# Patient Record
Sex: Female | Born: 1955 | Race: White | Hispanic: No | Marital: Married | Smoking: Never smoker
Health system: Southern US, Community
[De-identification: ages and names within clinical notes are randomized; demographics above are authoritative.]

## PROBLEM LIST (undated history)

## (undated) HISTORY — PX: BREAST BIOPSY: SHX20

## (undated) HISTORY — PX: BREAST EXCISIONAL BIOPSY: SUR124

---

## 1997-10-01 ENCOUNTER — Other Ambulatory Visit: Admission: RE | Admit: 1997-10-01 | Discharge: 1997-10-01 | Payer: Self-pay | Admitting: Obstetrics and Gynecology

## 1998-10-08 ENCOUNTER — Other Ambulatory Visit: Admission: RE | Admit: 1998-10-08 | Discharge: 1998-10-08 | Payer: Self-pay | Admitting: Obstetrics and Gynecology

## 1999-10-01 ENCOUNTER — Encounter: Admission: RE | Admit: 1999-10-01 | Discharge: 1999-10-01 | Payer: Self-pay | Admitting: Obstetrics and Gynecology

## 1999-10-01 ENCOUNTER — Encounter: Payer: Self-pay | Admitting: Obstetrics and Gynecology

## 1999-10-28 ENCOUNTER — Other Ambulatory Visit: Admission: RE | Admit: 1999-10-28 | Discharge: 1999-10-28 | Payer: Self-pay | Admitting: Obstetrics and Gynecology

## 2000-11-29 ENCOUNTER — Other Ambulatory Visit: Admission: RE | Admit: 2000-11-29 | Discharge: 2000-11-29 | Payer: Self-pay | Admitting: Obstetrics and Gynecology

## 2001-01-25 ENCOUNTER — Encounter: Admission: RE | Admit: 2001-01-25 | Discharge: 2001-01-25 | Payer: Self-pay | Admitting: Obstetrics and Gynecology

## 2001-01-25 ENCOUNTER — Encounter: Payer: Self-pay | Admitting: Obstetrics and Gynecology

## 2002-02-01 ENCOUNTER — Other Ambulatory Visit: Admission: RE | Admit: 2002-02-01 | Discharge: 2002-02-01 | Payer: Self-pay | Admitting: Obstetrics and Gynecology

## 2002-03-01 ENCOUNTER — Encounter: Admission: RE | Admit: 2002-03-01 | Discharge: 2002-03-01 | Payer: Self-pay | Admitting: Obstetrics and Gynecology

## 2002-03-01 ENCOUNTER — Encounter: Payer: Self-pay | Admitting: Obstetrics and Gynecology

## 2003-04-16 ENCOUNTER — Encounter: Admission: RE | Admit: 2003-04-16 | Discharge: 2003-04-16 | Payer: Self-pay | Admitting: Obstetrics and Gynecology

## 2003-06-27 ENCOUNTER — Other Ambulatory Visit: Admission: RE | Admit: 2003-06-27 | Discharge: 2003-06-27 | Payer: Self-pay | Admitting: Obstetrics and Gynecology

## 2004-08-19 ENCOUNTER — Ambulatory Visit (HOSPITAL_COMMUNITY): Admission: RE | Admit: 2004-08-19 | Discharge: 2004-08-19 | Payer: Self-pay | Admitting: Obstetrics and Gynecology

## 2005-09-23 ENCOUNTER — Ambulatory Visit (HOSPITAL_COMMUNITY): Admission: RE | Admit: 2005-09-23 | Discharge: 2005-09-23 | Payer: Self-pay | Admitting: Obstetrics and Gynecology

## 2006-12-24 ENCOUNTER — Ambulatory Visit (HOSPITAL_COMMUNITY): Admission: RE | Admit: 2006-12-24 | Discharge: 2006-12-24 | Payer: Self-pay | Admitting: Obstetrics and Gynecology

## 2008-03-20 ENCOUNTER — Ambulatory Visit (HOSPITAL_COMMUNITY): Admission: RE | Admit: 2008-03-20 | Discharge: 2008-03-20 | Payer: Self-pay | Admitting: Obstetrics & Gynecology

## 2009-10-03 ENCOUNTER — Ambulatory Visit (HOSPITAL_COMMUNITY): Admission: RE | Admit: 2009-10-03 | Discharge: 2009-10-03 | Payer: Self-pay | Admitting: Obstetrics & Gynecology

## 2011-01-07 ENCOUNTER — Other Ambulatory Visit (HOSPITAL_COMMUNITY): Payer: Self-pay | Admitting: Obstetrics & Gynecology

## 2011-01-07 DIAGNOSIS — Z1231 Encounter for screening mammogram for malignant neoplasm of breast: Secondary | ICD-10-CM

## 2011-01-15 ENCOUNTER — Ambulatory Visit (HOSPITAL_COMMUNITY)
Admission: RE | Admit: 2011-01-15 | Discharge: 2011-01-15 | Disposition: A | Discharge status: Home / Self Care | Payer: BC Managed Care – PPO | Source: Ambulatory Visit | Attending: Obstetrics & Gynecology | Admitting: Obstetrics & Gynecology

## 2011-01-15 DIAGNOSIS — Z1231 Encounter for screening mammogram for malignant neoplasm of breast: Secondary | ICD-10-CM | POA: Insufficient documentation

## 2012-05-25 ENCOUNTER — Other Ambulatory Visit (HOSPITAL_COMMUNITY): Payer: Self-pay | Admitting: Obstetrics & Gynecology

## 2012-05-25 DIAGNOSIS — Z1231 Encounter for screening mammogram for malignant neoplasm of breast: Secondary | ICD-10-CM

## 2012-05-27 ENCOUNTER — Ambulatory Visit (HOSPITAL_COMMUNITY)
Admission: RE | Admit: 2012-05-27 | Discharge: 2012-05-27 | Disposition: A | Discharge status: Home / Self Care | Payer: BC Managed Care – PPO | Source: Ambulatory Visit | Attending: Obstetrics & Gynecology | Admitting: Obstetrics & Gynecology

## 2012-05-27 DIAGNOSIS — Z1231 Encounter for screening mammogram for malignant neoplasm of breast: Secondary | ICD-10-CM | POA: Insufficient documentation

## 2013-05-26 ENCOUNTER — Other Ambulatory Visit (HOSPITAL_COMMUNITY): Payer: Self-pay | Admitting: Obstetrics & Gynecology

## 2013-05-26 DIAGNOSIS — Z1231 Encounter for screening mammogram for malignant neoplasm of breast: Secondary | ICD-10-CM

## 2013-06-26 ENCOUNTER — Ambulatory Visit (HOSPITAL_COMMUNITY)
Admission: RE | Admit: 2013-06-26 | Discharge: 2013-06-26 | Disposition: A | Discharge status: Home / Self Care | Payer: BC Managed Care – PPO | Source: Ambulatory Visit | Attending: Obstetrics & Gynecology | Admitting: Obstetrics & Gynecology

## 2013-06-26 DIAGNOSIS — Z1231 Encounter for screening mammogram for malignant neoplasm of breast: Secondary | ICD-10-CM | POA: Insufficient documentation

## 2014-06-11 ENCOUNTER — Other Ambulatory Visit (HOSPITAL_COMMUNITY): Payer: Self-pay | Admitting: Obstetrics & Gynecology

## 2014-06-11 DIAGNOSIS — Z1231 Encounter for screening mammogram for malignant neoplasm of breast: Secondary | ICD-10-CM

## 2014-06-28 ENCOUNTER — Ambulatory Visit (HOSPITAL_COMMUNITY)
Admission: RE | Admit: 2014-06-28 | Discharge: 2014-06-28 | Disposition: A | Discharge status: Home / Self Care | Payer: BLUE CROSS/BLUE SHIELD | Source: Ambulatory Visit | Attending: Obstetrics & Gynecology | Admitting: Obstetrics & Gynecology

## 2014-06-28 DIAGNOSIS — Z1231 Encounter for screening mammogram for malignant neoplasm of breast: Secondary | ICD-10-CM

## 2014-06-29 ENCOUNTER — Ambulatory Visit (HOSPITAL_COMMUNITY): Payer: BC Managed Care – PPO

## 2014-07-06 ENCOUNTER — Ambulatory Visit (HOSPITAL_COMMUNITY): Payer: BC Managed Care – PPO

## 2015-10-30 ENCOUNTER — Other Ambulatory Visit: Payer: Self-pay

## 2015-10-30 DIAGNOSIS — Z1231 Encounter for screening mammogram for malignant neoplasm of breast: Secondary | ICD-10-CM

## 2015-11-15 ENCOUNTER — Ambulatory Visit
Admission: RE | Admit: 2015-11-15 | Discharge: 2015-11-15 | Disposition: A | Discharge status: Home / Self Care | Payer: PRIVATE HEALTH INSURANCE | Source: Ambulatory Visit

## 2015-11-15 DIAGNOSIS — Z1231 Encounter for screening mammogram for malignant neoplasm of breast: Secondary | ICD-10-CM

## 2016-10-27 ENCOUNTER — Telehealth: Payer: Self-pay | Admitting: Cardiovascular Disease

## 2016-10-27 NOTE — Telephone Encounter (Signed)
Received records from [facility] for appointment on 10/29/16 with Dr Croitoru.  Records put with Dr Croitoru's schedule for 10/29/16. lp  °

## 2016-10-29 ENCOUNTER — Ambulatory Visit (INDEPENDENT_AMBULATORY_CARE_PROVIDER_SITE_OTHER): Payer: PRIVATE HEALTH INSURANCE | Admitting: Cardiovascular Disease

## 2016-10-29 ENCOUNTER — Encounter: Payer: Self-pay | Admitting: Cardiovascular Disease

## 2016-10-29 ENCOUNTER — Encounter (INDEPENDENT_AMBULATORY_CARE_PROVIDER_SITE_OTHER): Payer: Self-pay

## 2016-10-29 VITALS — BP 106/82 | HR 88 | Ht 66.0 in | Wt 125.0 lb

## 2016-10-29 DIAGNOSIS — R55 Syncope and collapse: Secondary | ICD-10-CM | POA: Diagnosis not present

## 2016-10-29 NOTE — Patient Instructions (Signed)
Dr Royann Shiversroitoru has requested that you have an echocardiogram. Echocardiography is a painless test that uses sound waves to create images of your heart. It provides your doctor with information about the size and shape of your heart and how well your heart's chambers and valves are working. This procedure takes approximately one hour. There are no restrictions for this procedure.  Your physician has recommended that you wear a 48-hour holter monitor. Holter monitors are medical devices that record the heart's electrical activity. Doctors most often use these monitors to diagnose arrhythmias. Arrhythmias are problems with the speed or rhythm of the heartbeat. The monitor is a small, portable device. You can wear one while you do your normal daily activities. This is usually used to diagnose what is causing palpitations/syncope (passing out).  These tests have been [facility] to be performed [facility] - [address], [address].  Dr Royann Shiversroitoru recommends that you schedule a follow-up appointment in 12 months. You will receive a reminder letter in the mail two months in advance. If you don't receive a letter, please call our office to schedule the follow-up appointment.  If you need a refill on your cardiac medications before your next appointment, please call your pharmacy.

## 2016-10-29 NOTE — Progress Notes (Signed)
Cardiology Consultation Note:    Date:  10/31/2016   ID:  Kelsey, Burton December 29, 1955, MRN [id number]  PCP:  Jarrett Soho, PA-C  Cardiologist:  Thurmon Fair, MD    Referring: Jarrett Soho PA  Chief Complaint  Patient presents with  . Follow-up    New patient.  . Headache   Kelsey Burton is a 61 y.o. female who is being seen today for the evaluation of Near syncope at the request of Jarrett Soho PA  History of Present Illness:    Kelsey Burton is a 61 y.o. female with a hx of Migraines, but without hypertension or known cardiovascular disease who had a single episode of near syncope roughly 10 days ago. It occurred around 9:00 in the morning when she was sitting down and watching television with her husband. "Felt funny". She has difficulty describing the exact complaints. She had nausea and felt the need to "run to the bathroom". She developed tunnel vision and was stumbling down the hallway. She did not lose consciousness completely but felt like she was very close to that.  When she checked her vital signs her blood pressure was 109/82 and heart rate was under 35 bpm. Heart rate gradually decreased over the next 30-60 minutes but was still over 100 bpm an hour later.  She denies history of syncope in the past or any similar vagal events. She does have long-standing occasional palpitations, but these have not been associated with near syncope or syncope. She had menopause roughly 8 years ago. She does not have diabetes, family history of cardiovascular disease or sudden unexpected death and she has never smoked. She has never had a stroke or TIA. She has had migraines with a relatively low frequency hearing  History reviewed. No pertinent past medical history.  History reviewed. No pertinent surgical history.  Current Medications: No outpatient prescriptions have been marked as taking for the 10/29/16 encounter (Office Visit) with Thurmon Fair, MD.       Allergies:   Patient has no allergy information on record.   Social History   Social History  . Marital status: Married    Spouse name: N/A  . Number of children: N/A  . Years of education: N/A   Social History Main Topics  . Smoking status: Never Smoker  . Smokeless tobacco: Never Used  . Alcohol use None  . Drug use: Unknown  . Sexual activity: Not Asked   Other Topics Concern  . None   Social History Narrative  . None     Family History: The patient's family history includes Cancer in her father and mother; Heart attack in her father. ROS:   Please see the history of present illness.     All other systems reviewed and are negative.  EKGs/Labs/Other Studies Reviewed:    The following studies were reviewed today: Notes from primary care provider. ECG tracing from May 15 showing normal sinus rhythm  EKG:  EKG is  ordered today.  The ekg ordered today demonstrates sinus rhythm. Unusually prominent R-wave in leads V1-V2 (Rsr').  Recent Labs: Hemoglobin 13.8, glucose 129, creatinine 0.81, potassium 3.8, normal liver function tests, normal TSH  Physical Exam:    VS:  BP 106/82   Pulse 88   Ht 5\' 6"  (1.676 m)   Wt 125 lb (56.7 kg)   BMI 20.18 kg/m     Wt Readings from Last 3 Encounters:  10/29/16 125 lb (56.7 kg)     GEN:  Well  nourished, well developed in no acute distress. She is very lean HEENT: Normal NECK: No JVD; No carotid bruits LYMPHATICS: No lymphadenopathy CARDIAC: RRR, no murmurs, rubs, gallops RESPIRATORY:  Clear to auscultation without rales, wheezing or rhonchi  ABDOMEN: Soft, non-tender, non-distended MUSCULOSKELETAL:  No edema; No deformity  SKIN: Warm and dry NEUROLOGIC:  Alert and oriented x 3 PSYCHIATRIC:  Normal affect   ASSESSMENT:    1. Syncope, unspecified syncope type    PLAN:    In order of problems listed above:  1. Near syncope: The description of her events is highly suggestive of neurally mediated/vagal near  syncope. She is very slender and has a typical body habitus for this disorder, but she has never experienced this ever before. The slowly resolving tachycardia is consistent with sinus tachycardia. This less likely that she had a true arrhythmia, but a Holter monitor is reasonable, especially since she also has a separate complaint of palpitations. We'll check an echocardiogram to rule out structural heart disease but if these 2 noninvasive studies did not show serious findings, would probably not pursue a cardiac diagnosis any further.   Medication Adjustments/Labs and Tests Ordered: Current medicines are reviewed at length with the patient today.  Concerns regarding medicines are outlined above. Labs and tests ordered and medication changes are outlined in the patient instructions below:  Patient Instructions  Dr Royann Shiversroitoru has requested that you have an echocardiogram. Echocardiography is a painless test that uses sound waves to create images of your heart. It provides your doctor with information about the size and shape of your heart and how well your heart's chambers and valves are working. This procedure takes approximately one hour. There are no restrictions for this procedure.  Your physician has recommended that you wear a 48-hour holter monitor. Holter monitors are medical devices that record the heart's electrical activity. Doctors most often use these monitors to diagnose arrhythmias. Arrhythmias are problems with the speed or rhythm of the heartbeat. The monitor is a small, portable device. You can wear one while you do your normal daily activities. This is usually used to diagnose what is causing palpitations/syncope (passing out).  These tests have been ordered to be performed at [facility] - [address], [address].  Dr Royann Shiversroitoru recommends that you schedule a follow-up appointment in 12 months. You will receive a reminder letter in the mail two months in advance. If you  don't receive a letter, please call our office to schedule the follow-up appointment.  If you need a refill on your cardiac medications before your next appointment, please call your pharmacy.    Signed, Thurmon FairMihai Sarahlynn Cisnero, MD  10/31/2016 10:48 PM    [facility]

## 2016-11-13 ENCOUNTER — Telehealth: Payer: Self-pay | Admitting: Cardiovascular Disease

## 2016-11-13 NOTE — Telephone Encounter (Addendum)
INSURANCE COMPANY IS REQUIRING OFFICE NOTE TO BE FAXED IN FOR PRIOR APPROVAL  OFFICE NOTE FAXED [phone] REF# [id number] ON THE FAX. PATIENT MADE AWARE ECHO WILL BE RESCH ONCE AUTH HAS BEEN COMPLETED  FAX RECORDS TO [phone] [id number]

## 2016-11-16 ENCOUNTER — Other Ambulatory Visit: Payer: Self-pay

## 2016-11-16 ENCOUNTER — Ambulatory Visit (INDEPENDENT_AMBULATORY_CARE_PROVIDER_SITE_OTHER): Payer: PRIVATE HEALTH INSURANCE

## 2016-11-16 ENCOUNTER — Other Ambulatory Visit (HOSPITAL_COMMUNITY): Payer: PRIVATE HEALTH INSURANCE

## 2016-11-16 ENCOUNTER — Ambulatory Visit (HOSPITAL_COMMUNITY): Payer: PRIVATE HEALTH INSURANCE | Attending: Cardiology

## 2016-11-16 DIAGNOSIS — R55 Syncope and collapse: Secondary | ICD-10-CM | POA: Diagnosis not present

## 2016-11-16 DIAGNOSIS — I10 Essential (primary) hypertension: Secondary | ICD-10-CM | POA: Insufficient documentation

## 2017-11-08 ENCOUNTER — Other Ambulatory Visit: Payer: Self-pay | Admitting: Family Medicine

## 2017-11-08 DIAGNOSIS — Z1231 Encounter for screening mammogram for malignant neoplasm of breast: Secondary | ICD-10-CM

## 2017-11-26 ENCOUNTER — Ambulatory Visit
Admission: RE | Admit: 2017-11-26 | Discharge: 2017-11-26 | Disposition: A | Discharge status: Home / Self Care | Payer: No Typology Code available for payment source | Source: Ambulatory Visit | Attending: Family Medicine | Admitting: Family Medicine

## 2017-11-26 DIAGNOSIS — Z1231 Encounter for screening mammogram for malignant neoplasm of breast: Secondary | ICD-10-CM

## 2019-01-06 ENCOUNTER — Other Ambulatory Visit: Payer: Self-pay | Admitting: Family Medicine

## 2019-01-06 DIAGNOSIS — Z1231 Encounter for screening mammogram for malignant neoplasm of breast: Secondary | ICD-10-CM

## 2019-02-22 ENCOUNTER — Other Ambulatory Visit: Payer: Self-pay

## 2019-02-22 ENCOUNTER — Ambulatory Visit (HOSPITAL_COMMUNITY)
Admission: EM | Admit: 2019-02-22 | Discharge: 2019-02-22 | Disposition: A | Discharge status: Home / Self Care | Payer: PRIVATE HEALTH INSURANCE | Attending: Family Medicine | Admitting: Family Medicine

## 2019-02-22 ENCOUNTER — Ambulatory Visit (INDEPENDENT_AMBULATORY_CARE_PROVIDER_SITE_OTHER): Payer: PRIVATE HEALTH INSURANCE

## 2019-02-22 ENCOUNTER — Telehealth: Payer: Self-pay

## 2019-02-22 ENCOUNTER — Encounter (HOSPITAL_COMMUNITY): Payer: Self-pay

## 2019-02-22 DIAGNOSIS — Z20828 Contact with and (suspected) exposure to other viral communicable diseases: Secondary | ICD-10-CM | POA: Diagnosis not present

## 2019-02-22 DIAGNOSIS — R042 Hemoptysis: Secondary | ICD-10-CM | POA: Insufficient documentation

## 2019-02-22 DIAGNOSIS — Z79899 Other long term (current) drug therapy: Secondary | ICD-10-CM | POA: Diagnosis not present

## 2019-02-22 DIAGNOSIS — R05 Cough: Secondary | ICD-10-CM

## 2019-02-22 DIAGNOSIS — Z803 Family history of malignant neoplasm of breast: Secondary | ICD-10-CM | POA: Insufficient documentation

## 2019-02-22 DIAGNOSIS — Z880 Allergy status to penicillin: Secondary | ICD-10-CM | POA: Insufficient documentation

## 2019-02-22 DIAGNOSIS — K219 Gastro-esophageal reflux disease without esophagitis: Secondary | ICD-10-CM | POA: Insufficient documentation

## 2019-02-22 DIAGNOSIS — R059 Cough, unspecified: Secondary | ICD-10-CM

## 2019-02-22 MED ORDER — PANTOPRAZOLE SODIUM 20 MG PO TBEC: 20.0000 mg | DELAYED_RELEASE_TABLET | Freq: Every day | ORAL | 0 refills | Status: AC

## 2019-02-22 NOTE — ED Provider Notes (Signed)
[facility]    CSN: [id number] Arrival date & time: 02/22/19  1001      History   Chief Complaint Chief Complaint  Patient presents with  . Cough    HPI Leona SingletonMartha M Jerry is a 63 y.o. female.   Leona SingletonMartha M Flett presents with complaints of cough for months. Yesterday productive of phlegm which had some red specks of blood, which concerned her. This has occurred today as well. Has had a headache for a few days. Voice hoarsness, this is not new however. No shortness of breath . Always a sensation of a "lump" in her throat she feels like she is trying to swallow. Waking her up at night. Not necessarily worse after eating. Cough at night primarily. Daytime cough has been alright. Some sore throat. States history Of nasal polyps and "sinus issues" for years. Hasn't been able to smell or taste in years. Has seen ENT in the past for these things. No new ear pain. Cough has been productive for two months. No fevers. Headache can be normal for her and is not necessarily new. No leg or feet swelling. Hasn't taken any medications for her symptoms. Two days ago felt dizzy and nausea, this has primarily resolved. No asthma or copd, has never smoked. Husband started coughing a few days ago. Doesn't work outside the home. No other known ill contacts.    ROS per HPI, negative if not otherwise mentioned.      History reviewed. No pertinent past medical history.  There are no active problems to display for this patient.   Past Surgical History:  Procedure Laterality Date  . BREAST BIOPSY Left    [id number] both left breast    OB History   No obstetric history on file.      Home Medications    Prior to Admission medications   Medication Sig Start Date End Date Taking? Authorizing Provider  pantoprazole (PROTONIX) 20 MG tablet Take 1 tablet (20 mg total) by mouth daily. 02/22/19   Georgetta HaberBurky, Katalea Ucci B, NP    Family History Family History  Problem Relation Age of Onset  .  Cancer Mother   . Breast cancer Mother 4062  . Cancer Father   . Heart attack Father     Social History Social History   Tobacco Use  . Smoking status: Never Smoker  . Smokeless tobacco: Never Used  Substance Use Topics  . Alcohol use: Not Currently  . Drug use: Not on file     Allergies   Amoxicillin   Review of Systems Review of Systems   Physical Exam Triage Vital Signs ED Triage Vitals  Enc Vitals Group     BP 02/22/19 1018 116/63     Pulse Rate 02/22/19 1018 83     Resp 02/22/19 1018 18     Temp 02/22/19 1018 97.8 F (36.6 C)     Temp Source 02/22/19 1018 Oral     SpO2 02/22/19 1027 98 %     Weight --      Height --      Head Circumference --      Peak Flow --      Pain Score 02/22/19 1015 2     Pain Loc --      Pain Edu? --      Excl. in GC? --    No data found.  Updated Vital Signs BP 116/63 (BP Location: Left Arm)   Pulse 83   Temp 97.8 F (36.6 C) (  Oral)   Resp 18   SpO2 98%    Physical Exam Constitutional:      General: She is not in acute distress.    Appearance: She is well-developed.  HENT:     Head: Normocephalic and atraumatic.     Comments: Hoarse voice noted     Right Ear: Tympanic membrane, ear canal and external ear normal.     Left Ear: Tympanic membrane, ear canal and external ear normal.     Nose: Nose normal.     Right Turbinates: Swollen.     Left Turbinates: Swollen.     Mouth/Throat:     Pharynx: Uvula midline.     Tonsils: No tonsillar exudate.  Eyes:     Conjunctiva/sclera: Conjunctivae normal.     Pupils: Pupils are equal, round, and reactive to light.  Cardiovascular:     Rate and Rhythm: Normal rate and regular rhythm.     Heart sounds: Normal heart sounds.  Pulmonary:     Effort: Pulmonary effort is normal.     Breath sounds: Normal breath sounds.     Comments: No cough throughout exam; patient brings with her a tissue with bright red blood stains which she says she produced from coughing  Musculoskeletal:      Right lower leg: No edema.     Left lower leg: No edema.  Skin:    General: Skin is warm and dry.  Neurological:     Mental Status: She is alert and oriented to person, place, and time.      UC Treatments / Results  Labs (all labs ordered are listed, but only abnormal results are displayed) Labs Reviewed  NOVEL CORONAVIRUS, NAA (HOSP ORDER, SEND-OUT TO REF LAB; TAT 18-24 HRS)    EKG   Radiology Dg Chest 2 View  Result Date: 02/22/2019 CLINICAL DATA:  Cough and hemoptysis. EXAM: CHEST - 2 VIEW COMPARISON:  None. FINDINGS: The heart size and mediastinal contours are within normal limits. Normal pulmonary vascularity. The lungs are hyperinflated. No focal consolidation, pleural effusion, or pneumothorax. No acute osseous abnormality. IMPRESSION: Hyperinflation.  No active cardiopulmonary disease. Electronically Signed   By: Obie Dredge M.D.   On: 02/22/2019 10:50    Procedures Procedures (including critical care time)  Medications Ordered in UC Medications - No data to display  Initial Impression / Assessment and Plan / UC Course  I have reviewed the triage vital signs and the nursing notes.  Pertinent labs & imaging results that were available during my care of the patient were reviewed by me and considered in my medical decision making (see chart for details).     Chest xray is reassuring. No fevers. No hypoxia. No edema. Heart size is normal on xray. Patient with chronic hoarseness and has been diagnosed with reflux in the past. History and exam does remain consistent with reflux. PPi recommended, at discharge patient states that she will not fill this script as she does not wish to get addicted to the medication. She states she will fill OTC pepcid. Encouraged close follow up with her PCP. Return precautions provided. Patient verbalized understanding and agreeable to plan.   Final Clinical Impressions(s) / UC Diagnoses   Final diagnoses:  Cough  Blood-tinged  sputum  Gastroesophageal reflux disease, esophagitis presence not specified     Discharge Instructions     Your chest xray looks well today, no indication of infection or swelling to your lungs. I don't have obvious reason to believe this is  your heart either.  I do feel your symptoms are consistent with severe reflux, which can cause persistent cough. Coughing persistently can then be irritating and cause some tissue breakdown and bleeding.  I would like you to start prononix daily.  Please follow up with your primary care provider in the next month if symptoms persist.  Covid testing pending, will notify you if this returns positive.  Any worsening of symptoms- increased dizziness, weakness, shortness of breath , fevers, or worsening of blood- please return to be seen or go to the ER.     ED Prescriptions    Medication Sig Dispense Auth. Provider   pantoprazole (PROTONIX) 20 MG tablet Take 1 tablet (20 mg total) by mouth daily. 30 tablet Zigmund Gottron, NP     Controlled Substance Prescriptions [place] Controlled Substance Registry consulted? Not Applicable   Zigmund Gottron, NP 02/22/19 1115

## 2019-02-22 NOTE — Discharge Instructions (Signed)
Your chest xray looks well today, no indication of infection or swelling to your lungs. I don't have obvious reason to believe this is your heart either.  I do feel your symptoms are consistent with severe reflux, which can cause persistent cough. Coughing persistently can then be irritating and cause some tissue breakdown and bleeding.  I would like you to start prononix daily.  Please follow up with your primary care provider in the next month if symptoms persist.  Covid testing pending, will notify you if this returns positive.  Any worsening of symptoms- increased dizziness, weakness, shortness of breath , fevers, or worsening of blood- please return to be seen or go to the ER.

## 2019-02-22 NOTE — ED Triage Notes (Signed)
Patient presents to Urgent Care with complaints of cough since several months ago. Patient reports she has been coughing up phlegm recently and yesterday had a few episodes of blood (small amount) that she coughed up, pt brought the blood with her on tissues to staff to see. Pt has also been having headaches and body aches.  Pt has never been covid tested, husband developed a cough a few days ago.

## 2019-02-23 ENCOUNTER — Ambulatory Visit
Admission: RE | Admit: 2019-02-23 | Discharge: 2019-02-23 | Disposition: A | Discharge status: Home / Self Care | Payer: PRIVATE HEALTH INSURANCE | Source: Ambulatory Visit | Attending: Family Medicine | Admitting: Family Medicine

## 2019-02-23 DIAGNOSIS — Z1231 Encounter for screening mammogram for malignant neoplasm of breast: Secondary | ICD-10-CM

## 2019-02-23 LAB — NOVEL CORONAVIRUS, NAA (HOSP ORDER, SEND-OUT TO REF LAB; TAT 18-24 HRS): SARS-CoV-2, NAA: NOT DETECTED

## 2019-02-24 ENCOUNTER — Encounter (HOSPITAL_COMMUNITY): Payer: Self-pay

## 2019-10-17 ENCOUNTER — Other Ambulatory Visit: Payer: Self-pay | Admitting: Family Medicine

## 2019-10-17 ENCOUNTER — Other Ambulatory Visit (HOSPITAL_COMMUNITY)
Admission: RE | Admit: 2019-10-17 | Discharge: 2019-10-17 | Disposition: A | Discharge status: Home / Self Care | Payer: BC Managed Care – PPO | Source: Ambulatory Visit | Attending: Family Medicine | Admitting: Family Medicine

## 2019-10-17 DIAGNOSIS — Z124 Encounter for screening for malignant neoplasm of cervix: Secondary | ICD-10-CM | POA: Diagnosis not present

## 2019-10-17 DIAGNOSIS — Z13220 Encounter for screening for lipoid disorders: Secondary | ICD-10-CM | POA: Diagnosis not present

## 2019-10-17 DIAGNOSIS — E559 Vitamin D deficiency, unspecified: Secondary | ICD-10-CM | POA: Diagnosis not present

## 2019-10-17 DIAGNOSIS — Z0000 Encounter for general adult medical examination without abnormal findings: Secondary | ICD-10-CM | POA: Diagnosis not present

## 2019-10-19 LAB — CYTOLOGY - PAP
Comment: NEGATIVE
Diagnosis: NEGATIVE
High risk HPV: NEGATIVE

## 2019-10-24 DIAGNOSIS — Z1211 Encounter for screening for malignant neoplasm of colon: Secondary | ICD-10-CM | POA: Diagnosis not present

## 2020-02-13 DIAGNOSIS — Z20822 Contact with and (suspected) exposure to covid-19: Secondary | ICD-10-CM | POA: Diagnosis not present

## 2020-02-27 ENCOUNTER — Other Ambulatory Visit: Payer: Self-pay | Admitting: Family Medicine

## 2020-02-27 DIAGNOSIS — Z1231 Encounter for screening mammogram for malignant neoplasm of breast: Secondary | ICD-10-CM

## 2020-02-29 ENCOUNTER — Ambulatory Visit
Admission: RE | Admit: 2020-02-29 | Discharge: 2020-02-29 | Disposition: A | Discharge status: Home / Self Care | Payer: BC Managed Care – PPO | Source: Ambulatory Visit | Attending: Family Medicine | Admitting: Family Medicine

## 2020-02-29 ENCOUNTER — Other Ambulatory Visit: Payer: Self-pay

## 2020-02-29 DIAGNOSIS — Z1231 Encounter for screening mammogram for malignant neoplasm of breast: Secondary | ICD-10-CM

## 2020-04-11 DIAGNOSIS — J343 Hypertrophy of nasal turbinates: Secondary | ICD-10-CM | POA: Diagnosis not present

## 2020-04-11 DIAGNOSIS — R040 Epistaxis: Secondary | ICD-10-CM | POA: Diagnosis not present

## 2020-04-11 DIAGNOSIS — J330 Polyp of nasal cavity: Secondary | ICD-10-CM | POA: Diagnosis not present

## 2020-04-11 DIAGNOSIS — R490 Dysphonia: Secondary | ICD-10-CM | POA: Diagnosis not present

## 2021-01-17 ENCOUNTER — Other Ambulatory Visit: Payer: Self-pay | Admitting: Family Medicine

## 2021-01-17 DIAGNOSIS — Z1231 Encounter for screening mammogram for malignant neoplasm of breast: Secondary | ICD-10-CM

## 2021-03-07 ENCOUNTER — Other Ambulatory Visit: Payer: Self-pay

## 2021-03-07 ENCOUNTER — Ambulatory Visit
Admission: RE | Admit: 2021-03-07 | Discharge: 2021-03-07 | Disposition: A | Discharge status: Home / Self Care | Payer: BC Managed Care – PPO | Source: Ambulatory Visit | Attending: Family Medicine | Admitting: Family Medicine

## 2021-03-07 DIAGNOSIS — Z1231 Encounter for screening mammogram for malignant neoplasm of breast: Secondary | ICD-10-CM

## 2021-04-28 DIAGNOSIS — H2513 Age-related nuclear cataract, bilateral: Secondary | ICD-10-CM | POA: Diagnosis not present

## 2021-04-28 DIAGNOSIS — H04123 Dry eye syndrome of bilateral lacrimal glands: Secondary | ICD-10-CM | POA: Diagnosis not present

## 2021-04-28 DIAGNOSIS — H524 Presbyopia: Secondary | ICD-10-CM | POA: Diagnosis not present

## 2021-04-28 DIAGNOSIS — H31091 Other chorioretinal scars, right eye: Secondary | ICD-10-CM | POA: Diagnosis not present

## 2021-04-28 DIAGNOSIS — H35412 Lattice degeneration of retina, left eye: Secondary | ICD-10-CM | POA: Diagnosis not present

## 2022-02-11 ENCOUNTER — Other Ambulatory Visit: Payer: Self-pay | Admitting: Family Medicine

## 2022-02-11 DIAGNOSIS — Z1231 Encounter for screening mammogram for malignant neoplasm of breast: Secondary | ICD-10-CM

## 2022-03-09 ENCOUNTER — Ambulatory Visit
Admission: RE | Admit: 2022-03-09 | Discharge: 2022-03-09 | Disposition: A | Discharge status: Home / Self Care | Payer: BC Managed Care – PPO | Source: Ambulatory Visit | Attending: Family Medicine | Admitting: Family Medicine

## 2022-03-09 DIAGNOSIS — Z1231 Encounter for screening mammogram for malignant neoplasm of breast: Secondary | ICD-10-CM | POA: Diagnosis not present

## 2022-06-08 DIAGNOSIS — C439 Malignant melanoma of skin, unspecified: Secondary | ICD-10-CM

## 2022-06-08 HISTORY — DX: Malignant melanoma of skin, unspecified: C43.9

## 2022-07-07 IMAGING — MG MM DIGITAL SCREENING BILAT W/ TOMO AND CAD
8 series · 9 of 24 positions shown · non-contrast
Comparison: Previous exam(s).

CLINICAL DATA: Screening.

EXAM:
DIGITAL SCREENING BILATERAL MAMMOGRAM WITH TOMOSYNTHESIS AND CAD
TECHNIQUE: Bilateral screening digital craniocaudal and mediolateral oblique
mammograms were obtained. Bilateral screening digital breast
tomosynthesis was performed. The images were evaluated with
computer-aided detection.

[R MLO synth-2D]
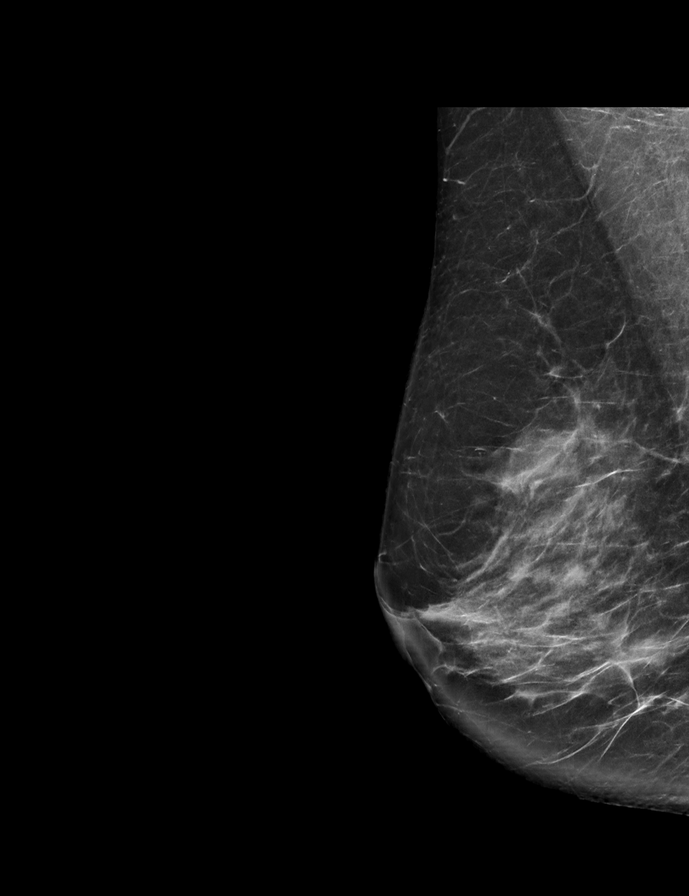

[L CC synth-2D]
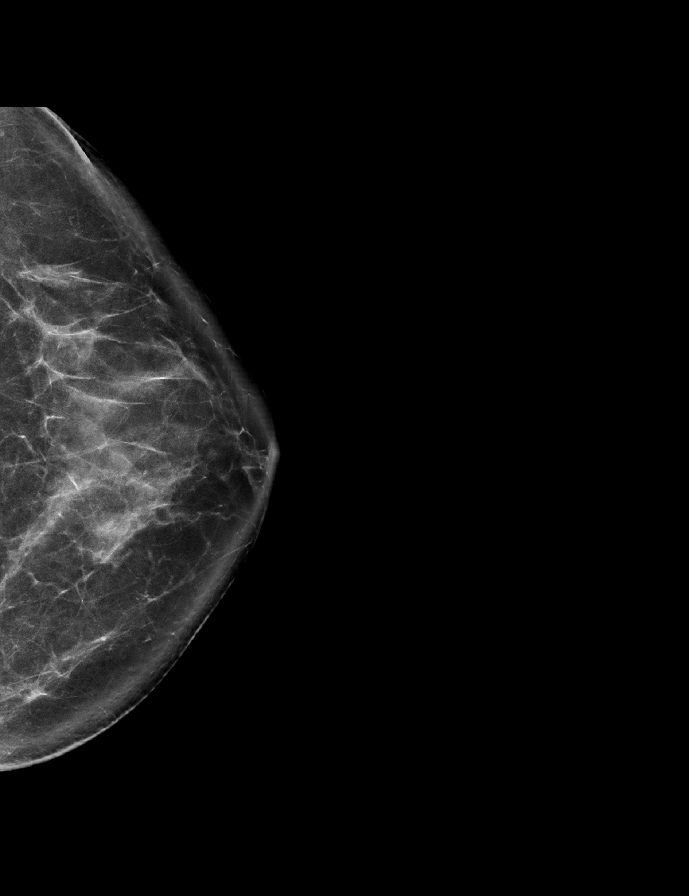

[L MLO synth-2D]
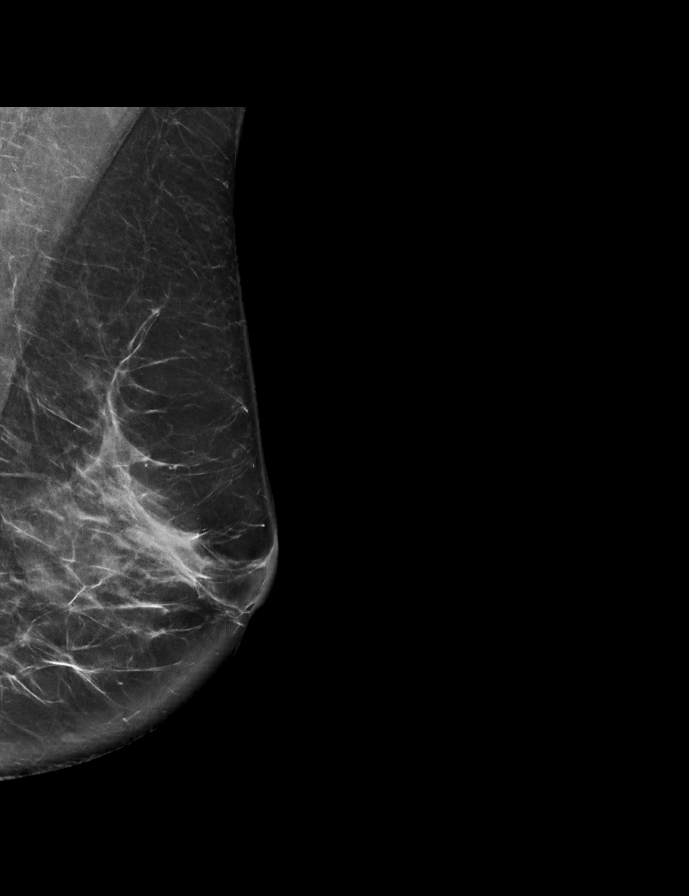

[R CC synth-2D]
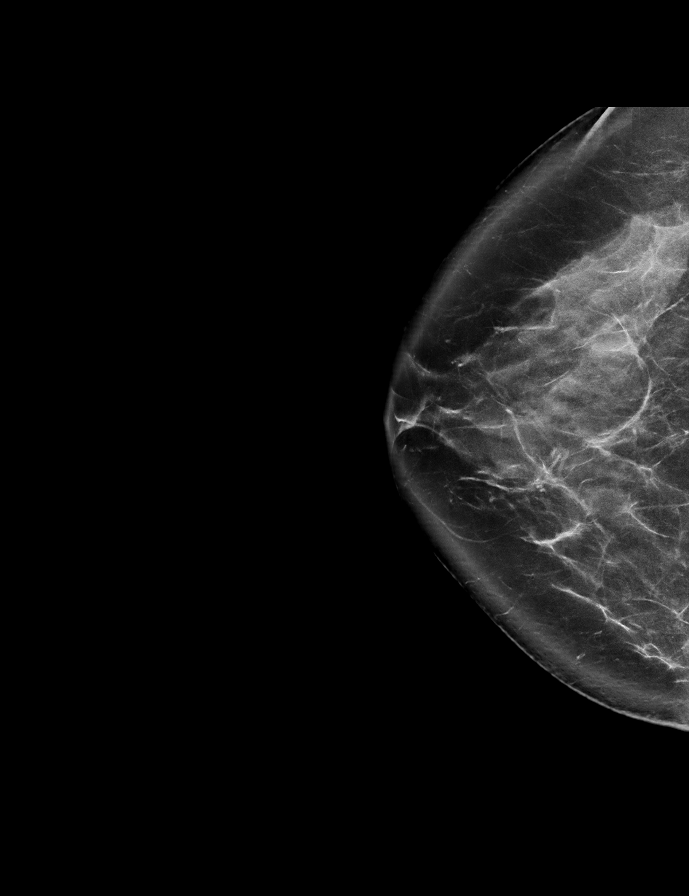

[R CC tomo · 2 of 84 frames shown]
[frame 28/84]
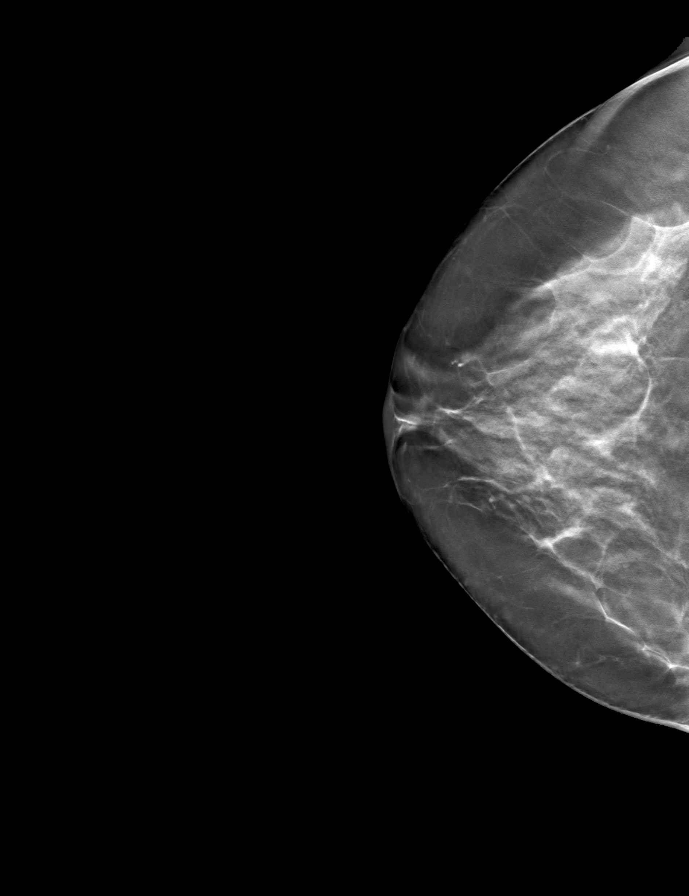
[frame 43/84]
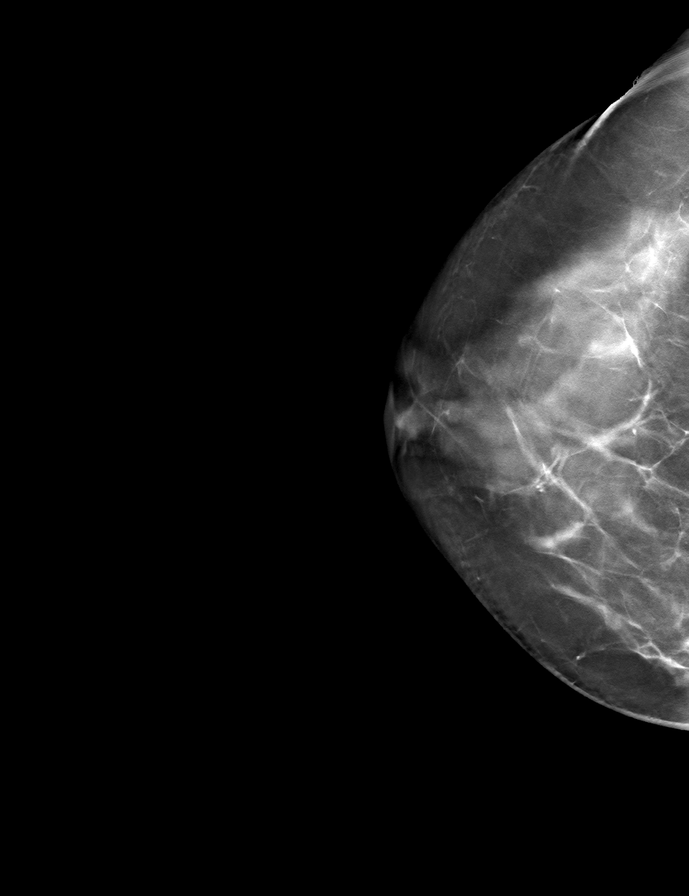

[L MLO tomo · tomo slice 39/77.0]
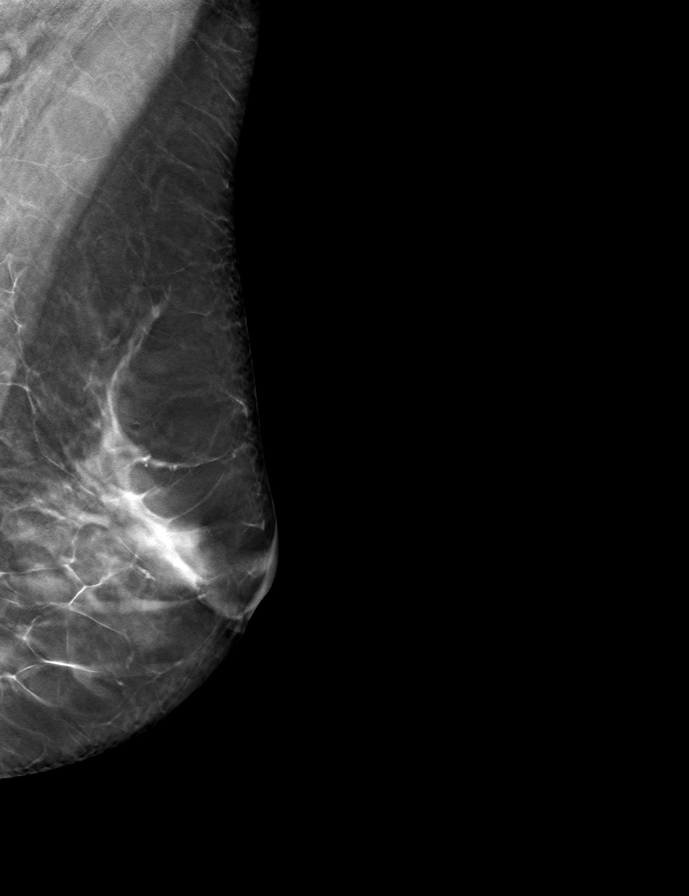

[R MLO tomo · tomo slice 39/77.0]
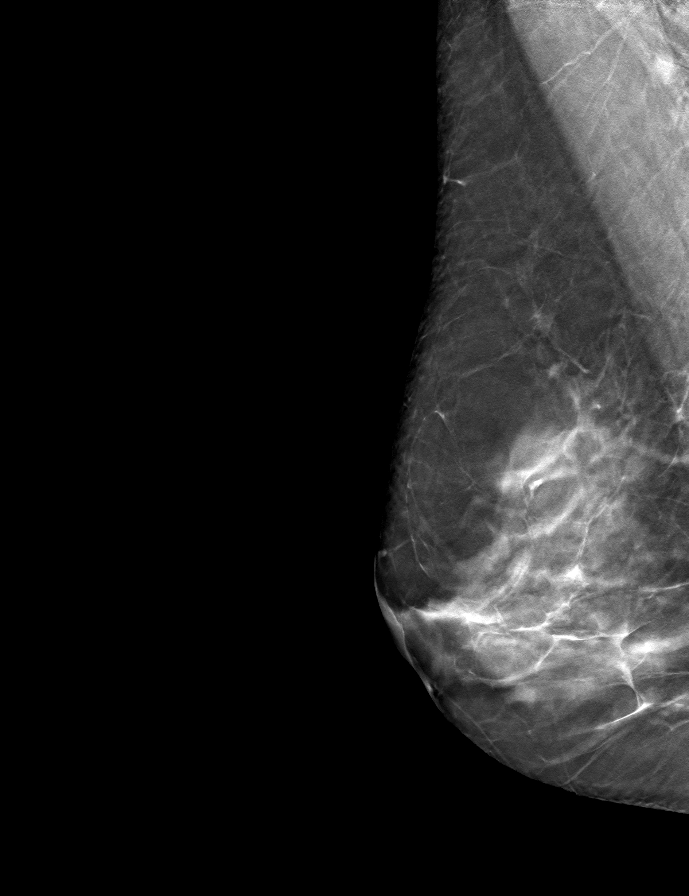

[L CC tomo · tomo slice 41/81.0]
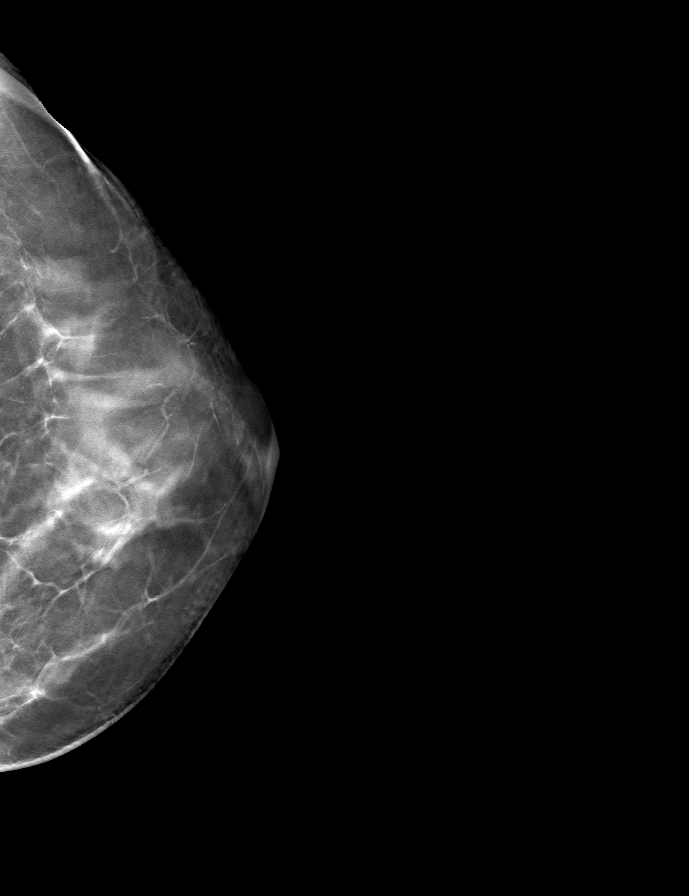

[9 of 24 positions shown; findings below may reference images not displayed]

ACR Breast Density Category c: The breast tissue is heterogeneously
dense, which may obscure small masses.
FINDINGS: There are no findings suspicious for malignancy.
IMPRESSION: No mammographic evidence of malignancy. A result letter of this
screening mammogram will be mailed directly to the patient.

RECOMMENDATION:
Screening mammogram in one year. (Code:Q3-W-BC3)

BI-RADS CATEGORY  1: Negative.

## 2022-07-09 DIAGNOSIS — Z1159 Encounter for screening for other viral diseases: Secondary | ICD-10-CM | POA: Diagnosis not present

## 2022-07-09 DIAGNOSIS — Z0000 Encounter for general adult medical examination without abnormal findings: Secondary | ICD-10-CM | POA: Diagnosis not present

## 2022-07-09 DIAGNOSIS — Z13220 Encounter for screening for lipoid disorders: Secondary | ICD-10-CM | POA: Diagnosis not present

## 2022-07-09 DIAGNOSIS — E559 Vitamin D deficiency, unspecified: Secondary | ICD-10-CM | POA: Diagnosis not present

## 2022-07-09 DIAGNOSIS — H6122 Impacted cerumen, left ear: Secondary | ICD-10-CM | POA: Diagnosis not present

## 2022-07-09 DIAGNOSIS — Z23 Encounter for immunization: Secondary | ICD-10-CM | POA: Diagnosis not present

## 2022-08-14 DIAGNOSIS — D485 Neoplasm of uncertain behavior of skin: Secondary | ICD-10-CM | POA: Diagnosis not present

## 2022-08-14 DIAGNOSIS — D2272 Melanocytic nevi of left lower limb, including hip: Secondary | ICD-10-CM | POA: Diagnosis not present

## 2022-08-14 DIAGNOSIS — D2262 Melanocytic nevi of left upper limb, including shoulder: Secondary | ICD-10-CM | POA: Diagnosis not present

## 2022-08-14 DIAGNOSIS — L820 Inflamed seborrheic keratosis: Secondary | ICD-10-CM | POA: Diagnosis not present

## 2022-08-14 DIAGNOSIS — C44519 Basal cell carcinoma of skin of other part of trunk: Secondary | ICD-10-CM | POA: Diagnosis not present

## 2022-08-14 DIAGNOSIS — L570 Actinic keratosis: Secondary | ICD-10-CM | POA: Diagnosis not present

## 2022-08-14 DIAGNOSIS — D2271 Melanocytic nevi of right lower limb, including hip: Secondary | ICD-10-CM | POA: Diagnosis not present

## 2022-08-14 DIAGNOSIS — D225 Melanocytic nevi of trunk: Secondary | ICD-10-CM | POA: Diagnosis not present

## 2022-08-25 DIAGNOSIS — D0359 Melanoma in situ of other part of trunk: Secondary | ICD-10-CM | POA: Diagnosis not present

## 2023-02-16 ENCOUNTER — Other Ambulatory Visit: Payer: Self-pay | Admitting: Family Medicine

## 2023-02-16 DIAGNOSIS — Z1231 Encounter for screening mammogram for malignant neoplasm of breast: Secondary | ICD-10-CM

## 2023-03-11 ENCOUNTER — Ambulatory Visit: Payer: BC Managed Care – PPO

## 2023-04-20 ENCOUNTER — Ambulatory Visit
Admission: RE | Admit: 2023-04-20 | Discharge: 2023-04-20 | Disposition: A | Discharge status: Home / Self Care | Payer: Medicare Other | Source: Ambulatory Visit | Attending: Family Medicine | Admitting: Family Medicine

## 2023-04-20 DIAGNOSIS — Z1231 Encounter for screening mammogram for malignant neoplasm of breast: Secondary | ICD-10-CM

## 2024-03-13 ENCOUNTER — Other Ambulatory Visit: Payer: Self-pay | Admitting: Family Medicine

## 2024-03-13 DIAGNOSIS — Z1231 Encounter for screening mammogram for malignant neoplasm of breast: Secondary | ICD-10-CM

## 2024-04-20 ENCOUNTER — Ambulatory Visit
Admission: RE | Admit: 2024-04-20 | Discharge: 2024-04-20 | Disposition: A | Source: Ambulatory Visit | Attending: Family Medicine | Admitting: Family Medicine

## 2024-04-20 DIAGNOSIS — Z1231 Encounter for screening mammogram for malignant neoplasm of breast: Secondary | ICD-10-CM
